# Patient Record
Sex: Male | Born: 1960 | Race: White | Hispanic: No | Smoking: Current every day smoker
Health system: Southern US, Community
[De-identification: ages and names within clinical notes are randomized; demographics above are authoritative.]

## PROBLEM LIST (undated history)

## (undated) DIAGNOSIS — E079 Disorder of thyroid, unspecified: Secondary | ICD-10-CM

## (undated) DIAGNOSIS — I10 Essential (primary) hypertension: Secondary | ICD-10-CM

## (undated) DIAGNOSIS — E7800 Pure hypercholesterolemia, unspecified: Secondary | ICD-10-CM

---

## 2004-04-08 ENCOUNTER — Emergency Department: Payer: Self-pay | Admitting: Emergency Medicine

## 2015-01-27 ENCOUNTER — Other Ambulatory Visit: Payer: Self-pay | Admitting: Pulmonary Disease

## 2015-01-27 ENCOUNTER — Ambulatory Visit
Admission: RE | Admit: 2015-01-27 | Discharge: 2015-01-27 | Disposition: A | Discharge status: Home / Self Care | Payer: Worker's Compensation | Source: Ambulatory Visit | Attending: Pulmonary Disease | Admitting: Pulmonary Disease

## 2015-01-27 ENCOUNTER — Encounter: Payer: Self-pay | Admitting: Emergency Medicine

## 2015-01-27 ENCOUNTER — Emergency Department
Admission: EM | Admit: 2015-01-27 | Discharge: 2015-01-27 | Disposition: A | Discharge status: Home / Self Care | Payer: Worker's Compensation | Attending: Emergency Medicine | Admitting: Emergency Medicine

## 2015-01-27 DIAGNOSIS — Z880 Allergy status to penicillin: Secondary | ICD-10-CM | POA: Insufficient documentation

## 2015-01-27 DIAGNOSIS — T1490XA Injury, unspecified, initial encounter: Secondary | ICD-10-CM

## 2015-01-27 DIAGNOSIS — G8929 Other chronic pain: Secondary | ICD-10-CM

## 2015-01-27 DIAGNOSIS — I10 Essential (primary) hypertension: Secondary | ICD-10-CM | POA: Insufficient documentation

## 2015-01-27 DIAGNOSIS — M47892 Other spondylosis, cervical region: Secondary | ICD-10-CM

## 2015-01-27 DIAGNOSIS — S0990XA Unspecified injury of head, initial encounter: Secondary | ICD-10-CM

## 2015-01-27 DIAGNOSIS — F17200 Nicotine dependence, unspecified, uncomplicated: Secondary | ICD-10-CM | POA: Diagnosis not present

## 2015-01-27 DIAGNOSIS — E039 Hypothyroidism, unspecified: Secondary | ICD-10-CM | POA: Insufficient documentation

## 2015-01-27 DIAGNOSIS — M542 Cervicalgia: Secondary | ICD-10-CM

## 2015-01-27 DIAGNOSIS — E785 Hyperlipidemia, unspecified: Secondary | ICD-10-CM | POA: Diagnosis not present

## 2015-01-27 HISTORY — DX: Pure hypercholesterolemia, unspecified: E78.00

## 2015-01-27 HISTORY — DX: Disorder of thyroid, unspecified: E07.9

## 2015-01-27 HISTORY — DX: Essential (primary) hypertension: I10
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
## 2015-01-27 NOTE — Discharge Instructions (Signed)
Follow up with [facility] to get Out Patient CT Scan of Neck.

## 2015-01-27 NOTE — ED Notes (Signed)
Pt has been updated on what he must do in order to avoid being charged for scan. Pt verbalized understanding.
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
